# Patient Record
Sex: Female | Born: 1975 | Race: Black or African American | Hispanic: No | Smoking: Never smoker
Health system: Southern US, Community
[De-identification: ages and names within clinical notes are randomized; demographics above are authoritative.]

## PROBLEM LIST (undated history)

## (undated) DIAGNOSIS — I10 Essential (primary) hypertension: Secondary | ICD-10-CM

## (undated) DIAGNOSIS — E119 Type 2 diabetes mellitus without complications: Secondary | ICD-10-CM

---

## 2001-03-26 ENCOUNTER — Other Ambulatory Visit: Admission: RE | Admit: 2001-03-26 | Discharge: 2001-03-26 | Payer: Self-pay | Admitting: Obstetrics and Gynecology

## 2002-03-27 ENCOUNTER — Other Ambulatory Visit: Admission: RE | Admit: 2002-03-27 | Discharge: 2002-03-27 | Payer: Self-pay | Admitting: Obstetrics and Gynecology

## 2002-12-17 ENCOUNTER — Other Ambulatory Visit: Admission: RE | Admit: 2002-12-17 | Discharge: 2002-12-17 | Payer: Self-pay | Admitting: Obstetrics and Gynecology

## 2003-02-10 ENCOUNTER — Other Ambulatory Visit: Admission: RE | Admit: 2003-02-10 | Discharge: 2003-02-10 | Payer: Self-pay | Admitting: *Deleted

## 2003-02-23 ENCOUNTER — Other Ambulatory Visit: Admission: RE | Admit: 2003-02-23 | Discharge: 2003-02-23 | Payer: Self-pay | Admitting: Obstetrics and Gynecology

## 2004-05-23 ENCOUNTER — Ambulatory Visit: Payer: Self-pay | Admitting: Family Medicine

## 2004-09-18 ENCOUNTER — Ambulatory Visit: Payer: Self-pay | Admitting: Internal Medicine

## 2005-05-17 ENCOUNTER — Inpatient Hospital Stay (HOSPITAL_COMMUNITY): Admission: AD | Admit: 2005-05-17 | Discharge: 2005-05-17 | Payer: Self-pay | Admitting: Obstetrics and Gynecology

## 2012-09-16 ENCOUNTER — Ambulatory Visit (INDEPENDENT_AMBULATORY_CARE_PROVIDER_SITE_OTHER): Payer: BC Managed Care – PPO | Admitting: Podiatry

## 2012-09-16 ENCOUNTER — Encounter: Payer: Self-pay | Admitting: Podiatry

## 2012-09-16 VITALS — BP 141/84 | HR 98

## 2012-09-16 DIAGNOSIS — M65979 Unspecified synovitis and tenosynovitis, unspecified ankle and foot: Secondary | ICD-10-CM

## 2012-09-16 DIAGNOSIS — R609 Edema, unspecified: Secondary | ICD-10-CM

## 2012-09-16 DIAGNOSIS — R600 Localized edema: Secondary | ICD-10-CM

## 2012-09-16 DIAGNOSIS — M659 Synovitis and tenosynovitis, unspecified: Secondary | ICD-10-CM

## 2012-09-16 NOTE — Progress Notes (Signed)
37 year old female presents complaining of pain and swelling on right foot since June 2014. Pain and swelling comes and goes regardless of type of shoes she is on. She has made a picture to show the degree of edema on right foot. She also has a history of right ankle sprain that happened last winter. Patient has a desk job. Today she has worn about 4" high heel shoes.   Objective: Swollen right foot lateral 1/2 including lateral ankle. No erythema noted. No skin lesions seen.  Positive of bilateral hallux valgus with bunion. Positive of elevated and hypermobile first Metatarsal bone bilateral.  Normal epicritic and tactile sensations bilateral. Normal range of motion forefoot and rearfoot joint exception of the hypermobile first MCJ bilateral.  Muscle strength is within normal. Radiographic examination reveal mild dorsal displacement of the first ray with increased first intermetatarsal angle and abducted hallux bilateral. No acute osseous changes at the lateral column of affected area right foot and ankle.  Assessment: Lesser metatarsalgia secondary to lateral weight shifting due to hypermobile first ray right. Deformity first ray bilateral. HAV with bunion bilateral.  Plan: Reviewed clinical findings and available treatment options. Patient is to try ankle brace to stabilize right foot from moving excessive frontal plane motion. Ankle brace dispensed for right. Patient is to try Metatarsal binder to stabilize the excess first ray motion in sagittal plane motion.  Return in one week with lace up walking shoes.  Will discuss to prepare for Orthotics on her next visit.

## 2012-09-23 ENCOUNTER — Ambulatory Visit (INDEPENDENT_AMBULATORY_CARE_PROVIDER_SITE_OTHER): Payer: BC Managed Care – PPO | Admitting: Podiatry

## 2012-09-23 DIAGNOSIS — R600 Localized edema: Secondary | ICD-10-CM | POA: Insufficient documentation

## 2012-09-23 DIAGNOSIS — M659 Synovitis and tenosynovitis, unspecified: Secondary | ICD-10-CM | POA: Insufficient documentation

## 2012-09-23 DIAGNOSIS — R609 Edema, unspecified: Secondary | ICD-10-CM

## 2012-09-23 MED ORDER — ARCH BANDAGE MISC: Status: AC

## 2012-09-23 NOTE — Progress Notes (Signed)
Follow up on right foot and ankle swelling. Ankle injury in March, swelling started in June.  Wearing dress flat shoes that is required at work.   Elevated first ray.  Increased STJ pronation. Ankle brace working well.  Assessment: Doing well with ankle brace and swelling is down. No swelling for the past week.  Plan: Will monitor for a few month before making orthotics per patient's request.

## 2012-10-15 ENCOUNTER — Other Ambulatory Visit: Payer: Self-pay | Admitting: *Deleted

## 2012-10-15 DIAGNOSIS — R609 Edema, unspecified: Secondary | ICD-10-CM

## 2012-11-05 ENCOUNTER — Encounter: Payer: Self-pay | Admitting: Vascular Surgery

## 2012-11-06 ENCOUNTER — Ambulatory Visit (HOSPITAL_COMMUNITY)
Admission: RE | Admit: 2012-11-06 | Discharge: 2012-11-06 | Disposition: A | Discharge status: Home / Self Care | Payer: BC Managed Care – PPO | Source: Ambulatory Visit | Attending: Vascular Surgery | Admitting: Vascular Surgery

## 2012-11-06 ENCOUNTER — Ambulatory Visit (INDEPENDENT_AMBULATORY_CARE_PROVIDER_SITE_OTHER): Payer: BC Managed Care – PPO | Admitting: Vascular Surgery

## 2012-11-06 ENCOUNTER — Encounter: Payer: Self-pay | Admitting: Vascular Surgery

## 2012-11-06 VITALS — BP 137/88 | HR 93 | Ht 62.0 in | Wt 176.2 lb

## 2012-11-06 DIAGNOSIS — R609 Edema, unspecified: Secondary | ICD-10-CM

## 2012-11-06 NOTE — Progress Notes (Signed)
[facility] [place] HISTORY AND PHYSICAL   History of Present Illness:  Patient is a 37 y.o. year old female who presents for evaluation of right foot and ankle swelling. The patient injured her right ankle approximately 9 months ago. Since that time she has had intermittent persistent swelling of the right foot and ankle area. This is somewhat improved but persists. It becomes more swollen and she is wearing certain types of shoes. She denies any prior history of DVT. She denies any prior history of lymphedema. She is scheduled for an MRI of the foot and ankle later this month. She is being followed by Dr. Penni Bombard. History reviewed. No pertinent past medical history. she denies prior trauma to the right lower extremity. No prior operations to her lower extremities  History reviewed. No pertinent past surgical history.   Social History History  Substance Use Topics  . Smoking status: Never Smoker   . Smokeless tobacco: Never Used  . Alcohol Use: Yes     Comment: socially    Family History Family History  Problem Relation Age of Onset  . Hypertension Mother     Allergies  Allergies  Allergen Reactions  . Penicillins Hives     Current Outpatient Prescriptions  Medication Sig Dispense Refill  . Azelastine HCl (ASTEPRO) 0.15 % SOLN Place into the nose as needed.      Marland Kitchen NUVARING 0.12-0.015 MG/24HR vaginal ring       . psyllium (REGULOID) 0.52 G capsule Take 0.52 g by mouth daily.       No current facility-administered medications for this visit.    ROS:   General:  No weight loss, Fever, chills  HEENT: No recent headaches, no nasal bleeding, no visual changes, no sore throat  Neurologic: No dizziness, blackouts, seizures. No recent symptoms of stroke or mini- stroke. No recent episodes of slurred speech, or temporary blindness.  Cardiac: No recent episodes of chest pain/pressure, no shortness of breath at rest.  No shortness of breath with exertion.   Denies history of atrial fibrillation or irregular heartbeat  Vascular: No history of rest pain in feet.  No history of claudication.  No history of non-healing ulcer, No history of DVT   Pulmonary: No home oxygen, no productive cough, no hemoptysis,  No asthma or wheezing  Musculoskeletal:  [ ]  Arthritis, [ ]  Low back pain,  [ ]  Joint pain  Hematologic:No history of hypercoagulable state.  No history of easy bleeding.  No history of anemia  Gastrointestinal: No hematochezia or melena,  No gastroesophageal reflux, no trouble swallowing  Urinary: [ ]  chronic Kidney disease, [ ]  on HD - [ ]  MWF or [ ]  TTHS, [ ]  Burning with urination, [ ]  Frequent urination, [ ]  Difficulty urinating;   Skin: No rashes  Psychological: No history of anxiety,  No history of depression   Physical Examination  Filed Vitals:   11/06/12 1326  BP: 137/88  Pulse: 93  Height: 5\' 2"  (1.575 m)  Weight: 176 lb 3.2 oz (79.924 kg)  SpO2: 100%    Body mass index is 32.22 kg/(m^2).  General:  Alert and oriented, no acute distress HEENT: Normal Neck: No bruit or JVD Pulmonary: Clear to auscultation bilaterally Cardiac: Regular Rate and Rhythm without murmur Abdomen: Soft, non-tender, non-distended, no mass Skin: No rash Extremity Pulses:  2+ radial, brachial, femoral, dorsalis pedis, posterior tibial pulses bilaterally Musculoskeletal: No deformity trace edema left calf ankle foot no buffalo hump no squaring of toes  no ulcers  Neurologic: Upper and lower extremity motor 5/5 and symmetric  DATA: Patient had a venous reflux exam today. She had evidence of deep venous reflux in the common femoral veins bilaterally. She also had some mild superficial reflux in the greater saphenous and saphenofemoral junction of the right leg.   ASSESSMENT: Chronic right foot and ankle swelling. This usually is not related to venous disease. She does have some evidence of mild deep and superficial venous reflux in the right  side. However if all of her symptoms were related to venous disease we would expect were swelling up the leg progresses as the day goes on and it is improved somewhat by elevation. Her symptoms did not really have all these features. Most of her swelling seems to be confined to the foot and ankle. This may still be secondary to her previous ankle trauma. However, she could have some symptoms of lymphedema. Her pending MRI may reveal more information regarding this. She had no evidence of arterial disease or DVT.   PLAN:  Discussed with the patient today weight loss program which may improve her reflux disease in her veins as well as her swelling. She will return on as-needed basis if the swelling becomes worse over time. If her symptoms persist or worsen would consider repeat venous study or give further consideration to possible lymphedema.  Fabienne Bruns, MD [facility] [place] Office: [phone] Pager: [phone]

## 2017-05-11 ENCOUNTER — Encounter (HOSPITAL_BASED_OUTPATIENT_CLINIC_OR_DEPARTMENT_OTHER): Payer: Self-pay | Admitting: Emergency Medicine

## 2017-05-11 ENCOUNTER — Emergency Department (HOSPITAL_BASED_OUTPATIENT_CLINIC_OR_DEPARTMENT_OTHER): Payer: BLUE CROSS/BLUE SHIELD

## 2017-05-11 ENCOUNTER — Emergency Department (HOSPITAL_BASED_OUTPATIENT_CLINIC_OR_DEPARTMENT_OTHER)
Admission: EM | Admit: 2017-05-11 | Discharge: 2017-05-11 | Disposition: A | Discharge status: Home / Self Care | Payer: BLUE CROSS/BLUE SHIELD | Attending: Emergency Medicine | Admitting: Emergency Medicine

## 2017-05-11 DIAGNOSIS — R000 Tachycardia, unspecified: Secondary | ICD-10-CM | POA: Diagnosis not present

## 2017-05-11 DIAGNOSIS — Z79899 Other long term (current) drug therapy: Secondary | ICD-10-CM | POA: Insufficient documentation

## 2017-05-11 DIAGNOSIS — M545 Low back pain: Secondary | ICD-10-CM | POA: Diagnosis not present

## 2017-05-11 DIAGNOSIS — R12 Heartburn: Secondary | ICD-10-CM | POA: Diagnosis not present

## 2017-05-11 LAB — CBC WITH DIFFERENTIAL/PLATELET
BASOS PCT: 0 %
Basophils Absolute: 0 10*3/uL (ref 0.0–0.1)
Eosinophils Absolute: 0.1 10*3/uL (ref 0.0–0.7)
Eosinophils Relative: 1 %
HCT: 35.3 % — ABNORMAL LOW (ref 36.0–46.0)
Hemoglobin: 11.8 g/dL — ABNORMAL LOW (ref 12.0–15.0)
LYMPHS PCT: 25 %
Lymphs Abs: 1.4 10*3/uL (ref 0.7–4.0)
MCH: 26.8 pg (ref 26.0–34.0)
MCHC: 33.4 g/dL (ref 30.0–36.0)
MCV: 80.2 fL (ref 78.0–100.0)
MONO ABS: 0.8 10*3/uL (ref 0.1–1.0)
MONOS PCT: 14 %
NEUTROS ABS: 3.4 10*3/uL (ref 1.7–7.7)
Neutrophils Relative %: 60 %
Platelets: 294 10*3/uL (ref 150–400)
RBC: 4.4 MIL/uL (ref 3.87–5.11)
RDW: 14.1 % (ref 11.5–15.5)
WBC: 5.6 10*3/uL (ref 4.0–10.5)

## 2017-05-11 LAB — BASIC METABOLIC PANEL
Anion gap: 12 (ref 5–15)
BUN: 7 mg/dL (ref 6–20)
CO2: 19 mmol/L — ABNORMAL LOW (ref 22–32)
CREATININE: 0.86 mg/dL (ref 0.44–1.00)
Calcium: 8.6 mg/dL — ABNORMAL LOW (ref 8.9–10.3)
Chloride: 104 mmol/L (ref 101–111)
GFR calc Af Amer: >60 mL/min (ref >60)
GFR calc non Af Amer: >60 mL/min (ref >60)
Glucose, Bld: 161 mg/dL — ABNORMAL HIGH (ref 65–99)
Potassium: 3.4 mmol/L — ABNORMAL LOW (ref 3.5–5.1)
Sodium: 135 mmol/L (ref 135–145)

## 2017-05-11 LAB — D-DIMER, QUANTITATIVE: D-Dimer, Quant: 1.16 ug/mL-FEU — ABNORMAL HIGH (ref 0.00–0.50)

## 2017-05-11 LAB — TROPONIN I

## 2017-05-11 LAB — LIPASE, BLOOD: Lipase: 22 U/L (ref 11–51)

## 2017-05-11 LAB — PREGNANCY, URINE: Preg Test, Ur: NEGATIVE

## 2017-05-11 MED ORDER — SUCRALFATE 1 GM/10ML PO SUSP
1.0000 g | Freq: Once | ORAL | Status: AC
  Administered 2017-05-11: 1 g via ORAL
  Filled 2017-05-11: qty 10

## 2017-05-11 MED ORDER — PANTOPRAZOLE SODIUM 40 MG IV SOLR
40.0000 mg | Freq: Once | INTRAVENOUS | Status: AC
Start: 2017-05-11 — End: 2017-05-11
  Administered 2017-05-11: 40 mg via INTRAVENOUS
  Filled 2017-05-11: qty 40

## 2017-05-11 MED ORDER — OMEPRAZOLE 20 MG PO CPDR: DELAYED_RELEASE_CAPSULE | ORAL | 0 refills | Status: AC

## 2017-05-11 MED ORDER — IOPAMIDOL (ISOVUE-370) INJECTION 76%
100.0000 mL | Freq: Once | INTRAVENOUS | Status: AC | PRN
  Administered 2017-05-11: 100 mL via INTRAVENOUS

## 2017-05-11 MED ORDER — SUCRALFATE 1 G PO TABS: 1.0000 g | ORAL_TABLET | Freq: Three times a day (TID) | ORAL | 0 refills | Status: AC

## 2017-05-11 NOTE — ED Notes (Signed)
[facility] Pt now admitting to 6 hrs of travel ~ 1 week ago. 3 hour drive one way.

## 2017-05-11 NOTE — ED Notes (Signed)
Alert, NAD, calm, interactive, resps e/u, speaking in clear complete sentences, no dyspnea noted, skin W&D, VSS, c/o chest and back pain, HR tachy, has nuvaring,(denies: long distance travel, recent surgery, smoking, nausea, fever, dizziness or visual changes). [facility]

## 2017-05-11 NOTE — ED Notes (Signed)
No changes, alert, NAD, calm, no dyspnea, to xray.

## 2017-05-11 NOTE — ED Provider Notes (Signed)
[facility] Provider Note: Lowella DellJ. Lane Elric Tirado, MD, FACEP  CSN: [id number] MRN: [id number] ARRIVAL: 05/11/17 at 0316 ROOM: MH02/MH02   CHIEF COMPLAINT  Chest Pain and Back Pain   HISTORY OF PRESENT ILLNESS  05/11/17 4:46 AM Wendy Mueller is a 42 y.o. female with a 3-day history of substernal discomfort which she describes as heartburn.  The heartburn is worse after eating.  She has had some transient relief with antacids but the pain keeps returning.  She has had no associated shortness of breath, nausea, vomiting, diarrhea, lightheadedness or weakness.  Yesterday evening she developed pain in her left back.  She describes this pain as aching and is somewhat intermittent.  She rates it as a 5 out of 10.  It is not worse with deep breathing or movement.  She denies lower extremity pain or edema but has had recent car travel.  She was noted to be tachycardic on arrival.   History reviewed. No pertinent past medical history.  No past surgical history on file.  Family History  Problem Relation Age of Onset  . Hypertension Mother     Social History   Tobacco Use  . Smoking status: Never Smoker  . Smokeless tobacco: Never Used  Substance Use Topics  . Alcohol use: Yes    Comment: socially  . Drug use: No    Prior to Admission medications   Medication Sig Start Date End Date Taking? Authorizing Provider  Azelastine HCl (ASTEPRO) 0.15 % SOLN Place into the nose as needed.    [provider]  NUVARING 0.12-0.015 MG/24HR vaginal ring  09/06/12   [provider]  psyllium (REGULOID) 0.52 G capsule Take 0.52 g by mouth daily.    [provider]    Allergies Penicillins   REVIEW OF SYSTEMS  Negative except as noted here or in the History of Present Illness.   PHYSICAL EXAMINATION  Initial Vital Signs Blood pressure (!) 147/90, pulse 100, resp. rate 15, height 5\' 2"  (1.575 m), weight 84.8 kg (187 lb), SpO2 99 %.  Examination General:  Well-developed, well-nourished female in no acute distress; appearance consistent with age of record HENT: normocephalic; atraumatic Eyes: pupils equal, round and reactive to light; extraocular muscles intact Neck: supple Heart: regular rate and rhythm; tachycardia Lungs: clear to auscultation bilaterally Abdomen: soft; nondistended; nontender; no masses or hepatosplenomegaly; bowel sounds present Back: Nontender Extremities: No deformity; full range of motion; pulses normal; no edema; no calf tenderness Neurologic: Awake, alert and oriented; motor function intact in all extremities and symmetric; no facial droop Skin: Warm and dry Psychiatric: Anxious   RESULTS  Summary of this visit's results, reviewed by myself:   EKG Interpretation  Date/Time:  Saturday May 11 2017 03:33:06 EDT Ventricular Rate:  103 PR Interval:    QRS Duration: 78 QT Interval:  332 QTC Calculation: 435 R Axis:   61 Text Interpretation:  Sinus tachycardia Probable left atrial enlargement No previous ECGs available Confirmed by Cordon Gassett, Jonny RuizJohn ([id number]) on 05/11/2017 3:42:33 AM      Laboratory Studies: Results for orders placed or performed during the hospital encounter of 05/11/17 (from the past 24 hour(s))  Basic metabolic panel     Status: Abnormal   Collection Time: 05/11/17  3:52 AM  Result Value Ref Range   Sodium 135 135 - 145 mmol/L   Potassium 3.4 (L) 3.5 - 5.1 mmol/L   Chloride 104 101 - 111 mmol/L   CO2 19 (L) 22 - 32 mmol/L  Glucose, Bld 161 (H) 65 - 99 mg/dL   BUN 7 6 - 20 mg/dL   Creatinine, Ser 1.61 0.44 - 1.00 mg/dL   Calcium 8.6 (L) 8.9 - 10.3 mg/dL   GFR calc non Af Amer >60 >60 mL/min   GFR calc Af Amer >60 >60 mL/min   Anion gap 12 5 - 15  Troponin I     Status: None   Collection Time: 05/11/17  3:52 AM  Result Value Ref Range   Troponin I <0.03 <0.03 ng/mL  Pregnancy, urine     Status: None   Collection Time: 05/11/17  3:52 AM  Result Value Ref Range   Preg Test, Ur NEGATIVE  NEGATIVE  CBC with Differential/Platelet     Status: Abnormal   Collection Time: 05/11/17  3:52 AM  Result Value Ref Range   WBC 5.6 4.0 - 10.5 K/uL   RBC 4.40 3.87 - 5.11 MIL/uL   Hemoglobin 11.8 (L) 12.0 - 15.0 g/dL   HCT 09.6 (L) 04.5 - 40.9 %   MCV 80.2 78.0 - 100.0 fL   MCH 26.8 26.0 - 34.0 pg   MCHC 33.4 30.0 - 36.0 g/dL   RDW 81.1 91.4 - 78.2 %   Platelets 294 150 - 400 K/uL   Neutrophils Relative % 60 %   Neutro Abs 3.4 1.7 - 7.7 K/uL   Lymphocytes Relative 25 %   Lymphs Abs 1.4 0.7 - 4.0 K/uL   Monocytes Relative 14 %   Monocytes Absolute 0.8 0.1 - 1.0 K/uL   Eosinophils Relative 1 %   Eosinophils Absolute 0.1 0.0 - 0.7 K/uL   Basophils Relative 0 %   Basophils Absolute 0.0 0.0 - 0.1 K/uL  D-dimer, quantitative (not at [facility])     Status: Abnormal   Collection Time: 05/11/17  3:52 AM  Result Value Ref Range   D-Dimer, Quant 1.16 (H) 0.00 - 0.50 ug/mL-FEU  Lipase, blood     Status: None   Collection Time: 05/11/17  3:52 AM  Result Value Ref Range   Lipase 22 11 - 51 U/L   Imaging Studies: Dg Chest 2 View  Result Date: 05/11/2017 CLINICAL DATA:  42 year old female with chest pain. EXAM: CHEST - 2 VIEW COMPARISON:  None. FINDINGS: The heart size and mediastinal contours are within normal limits. Both lungs are clear. The visualized skeletal structures are unremarkable. IMPRESSION: No active cardiopulmonary disease. Electronically Signed   By: Elgie Collard M.D.   On: 05/11/2017 04:04   Ct Angio Chest Pe W And/or Wo Contrast  Result Date: 05/11/2017 CLINICAL DATA:  42 y/o F; several days of indigestion. Positive D-dimer. EXAM: CT ANGIOGRAPHY CHEST WITH CONTRAST TECHNIQUE: Multidetector CT imaging of the chest was performed using the standard protocol during bolus administration of intravenous contrast. Multiplanar CT image reconstructions and MIPs were obtained to evaluate the vascular anatomy. CONTRAST:  ISOVUE-370 IOPAMIDOL (ISOVUE-370) INJECTION 76% COMPARISON:   05/11/2017 chest radiograph FINDINGS: Cardiovascular: Satisfactory opacification of the pulmonary arteries to the segmental level. No evidence of pulmonary embolism. Normal heart size. No pericardial effusion. Mediastinum/Nodes: No enlarged mediastinal, hilar, or axillary lymph nodes. Thyroid gland, trachea, and esophagus demonstrate no significant findings. Lungs/Pleura: Lungs are clear. No pleural effusion or pneumothorax. Upper Abdomen: No acute abnormality. Musculoskeletal: No chest wall abnormality. No acute or significant osseous findings. Review of the MIP images confirms the above findings. IMPRESSION: 1. No pulmonary embolus identified. 2. Unremarkable CTA of the chest. Electronically Signed   By: Buzzy Han.D.  On: 05/11/2017 06:15    ED COURSE  Nursing notes and initial vitals signs, including pulse oximetry, reviewed.  Vitals:   05/11/17 0530 05/11/17 0545 05/11/17 0600 05/11/17 0615  BP: (!) 143/85 (!) 140/93 134/90 133/87  Pulse: (!) 106 96 94 90  Resp: (!) 22 17 19 16   SpO2: 99% 100% 98% 100%  Weight:      Height:       6:25 AM Patient advised of reassuring diagnostic studies.  Her vital signs have normalized.  I suspect her heartburn is due to acid reflux or gastritis.  We will start her on a PPI and Carafate.  The back pain may or may not be related but we have ruled out ulnar embolism.  PROCEDURES    ED DIAGNOSES     ICD-10-CM   1. Heartburn R12        Malaki Koury, Jonny Ruiz, MD 05/11/17 [phone]

## 2017-05-11 NOTE — ED Triage Notes (Signed)
Pt states 2-3 days ago started experiencing what she thought was indigestion. Pt took antacids with minimal relief. Pt states the pain feels different and is in her back. Pt denies SHOB, N/V, lightheadness or dizziness. Pain described as aching and intermittent

## 2017-05-11 NOTE — ED Notes (Signed)
Back from CT, speaking on phone, no changes, alert, NAD, calm, interactive, rates upper back pain 4/10, (denies: sob, nausea, dizziness or other sx), [facility].

## 2017-05-11 NOTE — ED Notes (Signed)
EDP into room, at [facility].  ?

## 2017-05-11 NOTE — ED Notes (Signed)
Up to b/r, steady gait, denies dizziness.

## 2017-05-11 NOTE — ED Notes (Signed)
Back from xray, alert, NAD, calm, no changes.   

## 2020-03-07 IMAGING — DX DG CHEST 2V
2 series · 2 of 2 positions shown · non-contrast
Comparison: None.

CLINICAL DATA: 41-year-old female with chest pain.

EXAM:
CHEST - 2 VIEW

[chest pa]
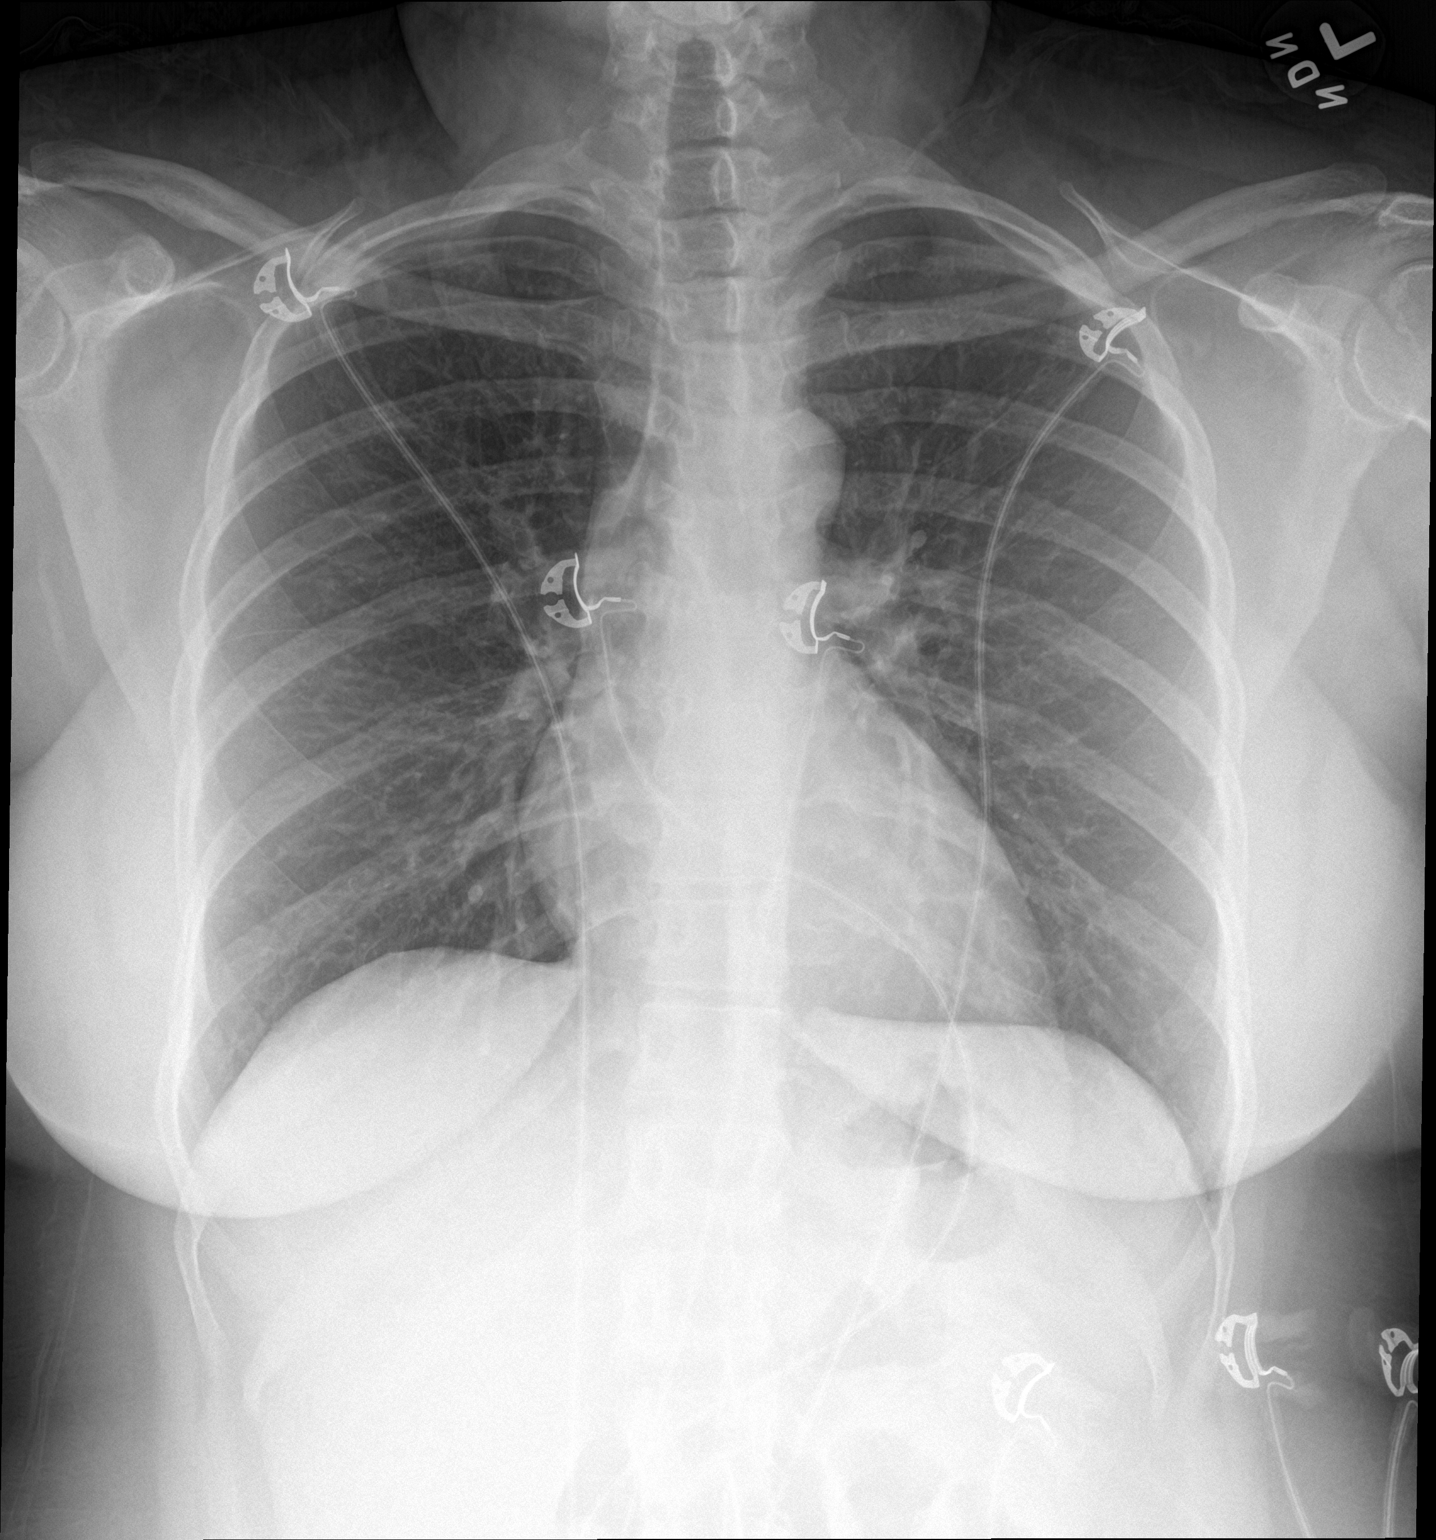

[chest lat]
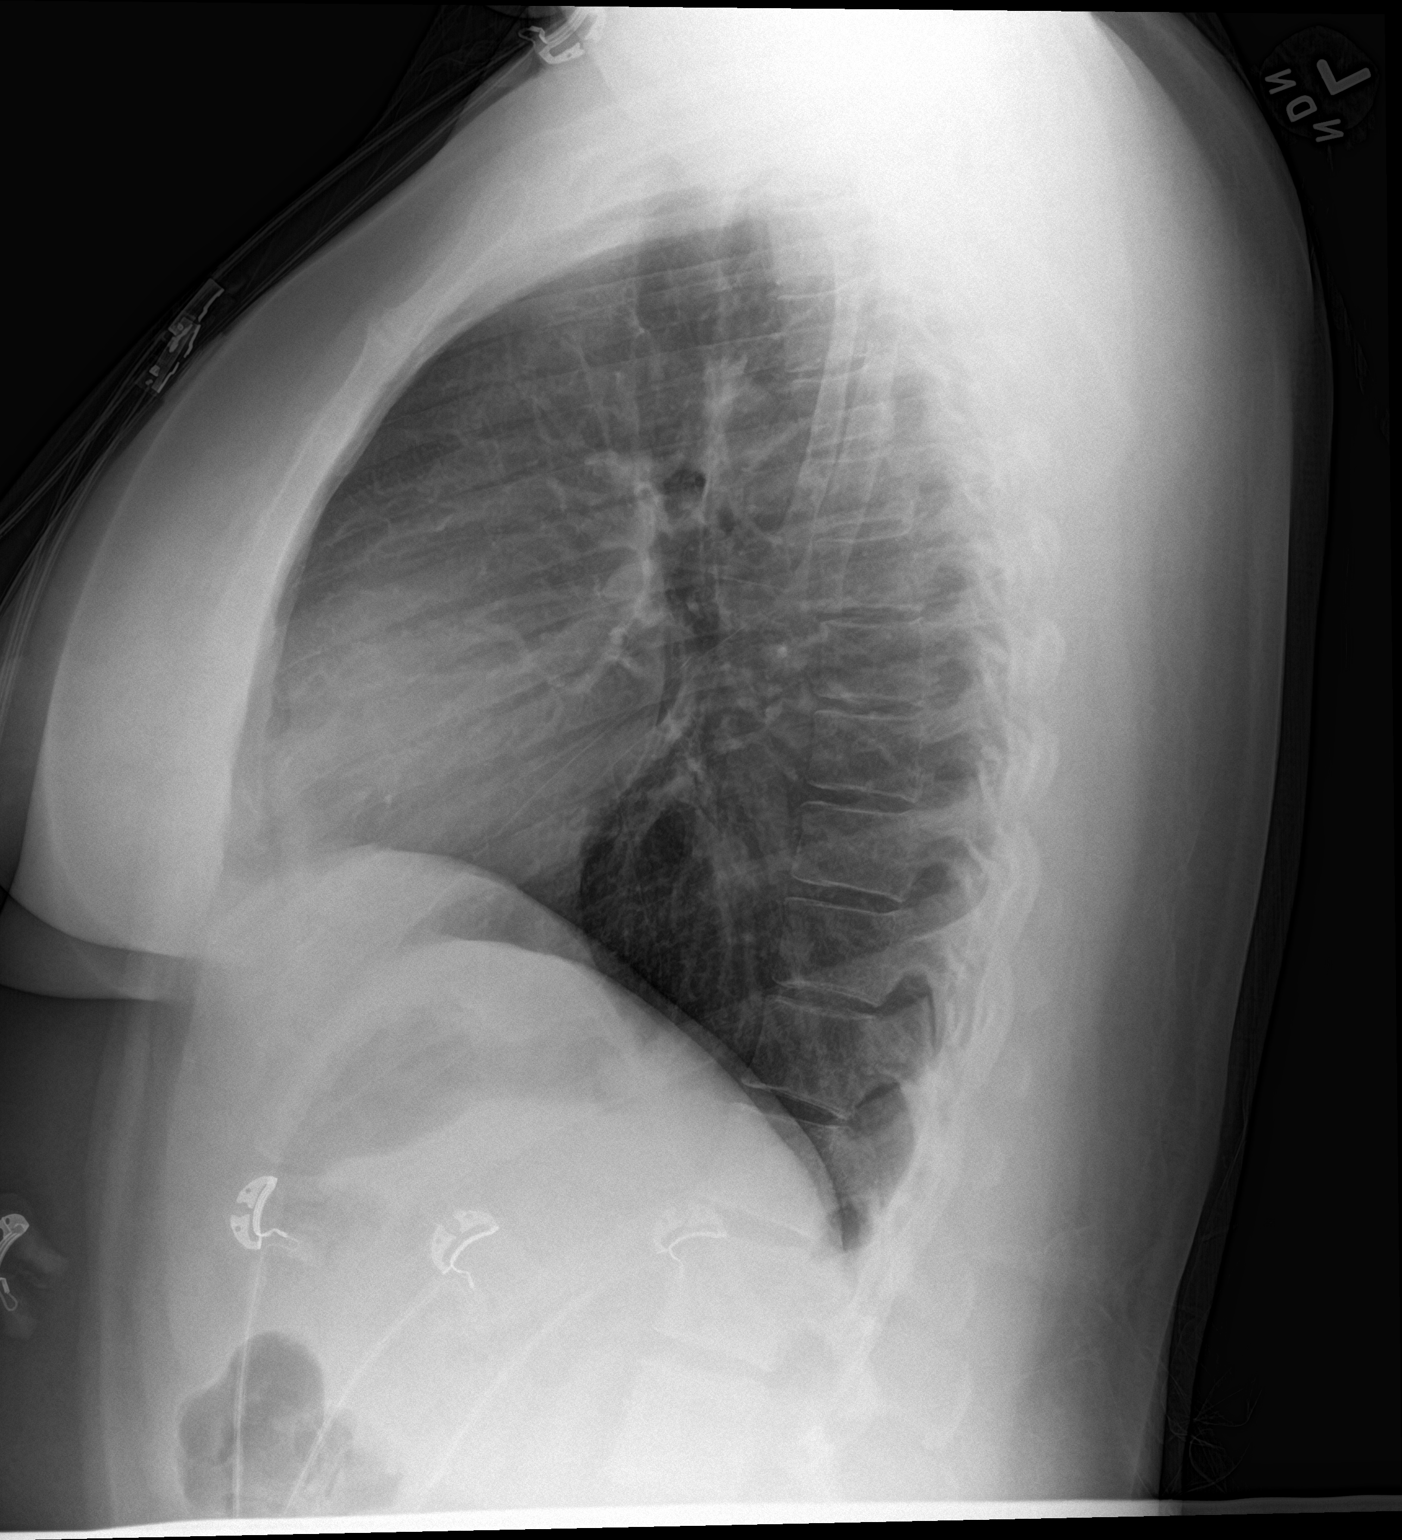

[2 of 2 positions shown; findings below may reference images not displayed]

FINDINGS: The heart size and mediastinal contours are within normal limits.
Both lungs are clear. The visualized skeletal structures are
unremarkable.
IMPRESSION: No active cardiopulmonary disease.

## 2020-03-07 IMAGING — CT CT ANGIO CHEST
2 of 8 series · 19 of 36 positions shown · IV contrast (iopamidol)
Comparison: 05/11/2017 chest radiograph

CLINICAL DATA: 41 y/o F; several days of indigestion. Positive
D-dimer.

EXAM:
CT ANGIOGRAPHY CHEST WITH CONTRAST
TECHNIQUE: Multidetector CT imaging of the chest was performed using the
standard protocol during bolus administration of intravenous
contrast. Multiplanar CT image reconstructions and MIPs were
obtained to evaluate the vascular anatomy.
CONTRAST:  100mL DQ2G58-NVM IOPAMIDOL (DQ2G58-NVM) INJECTION 76%

[Series 6: pe thins · axial · 0.63mm/px · z∈[+1181,+1424]mm · 18 of 273 slices shown]
[im 15/273  lung]
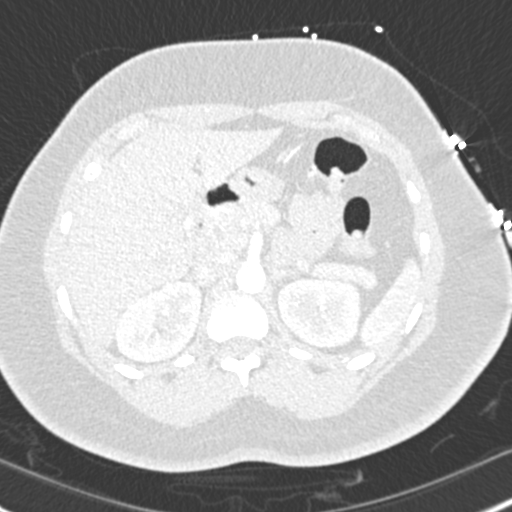
[im 29/273  mediastinal]
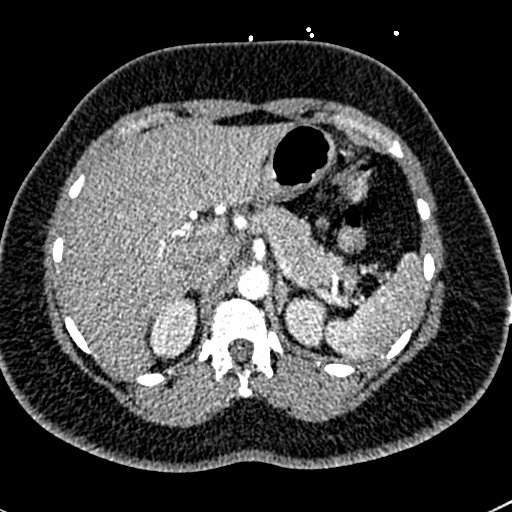
[im 43/273  lung]
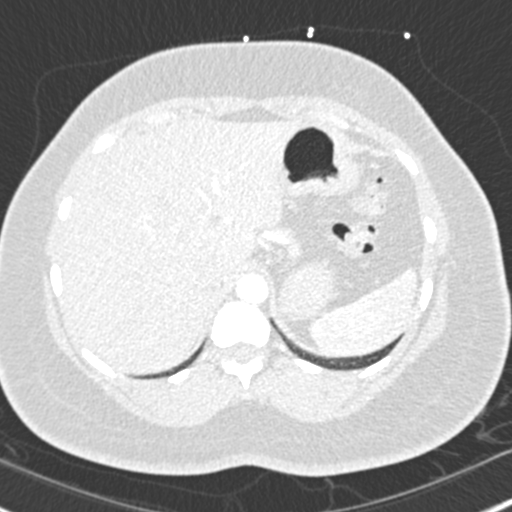
[im 58/273  mediastinal]
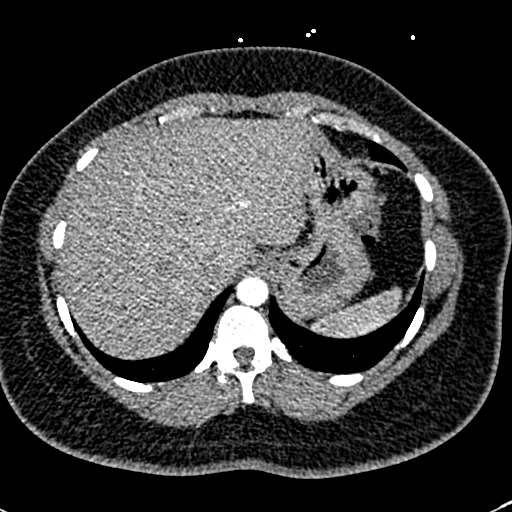
[im 72/273  lung]
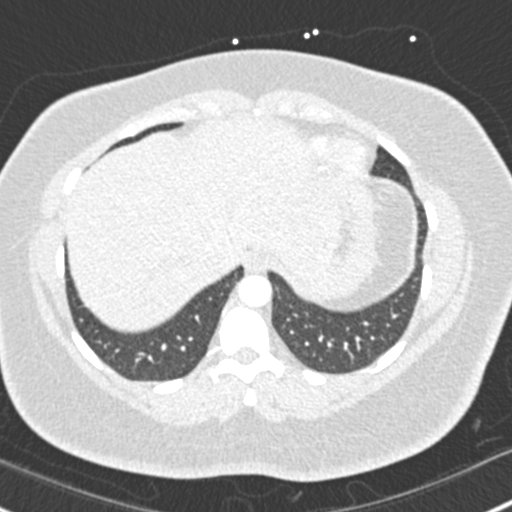
[im 86/273  mediastinal]
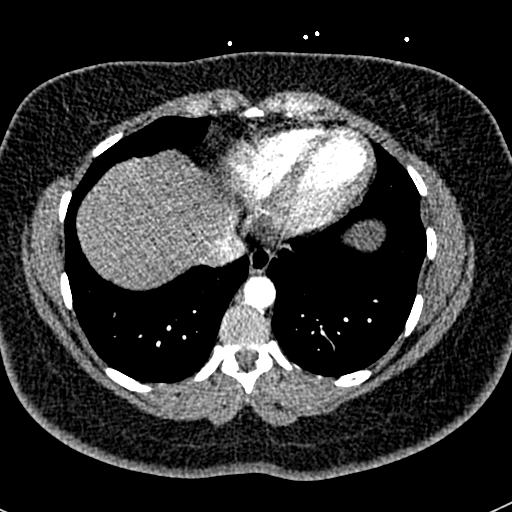
[im 101/273  lung]
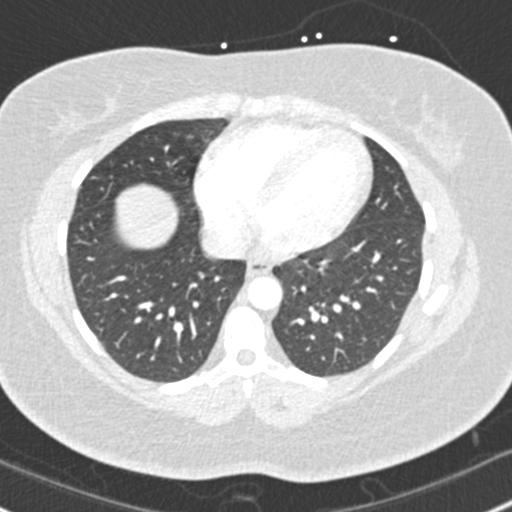
[im 115/273  mediastinal]
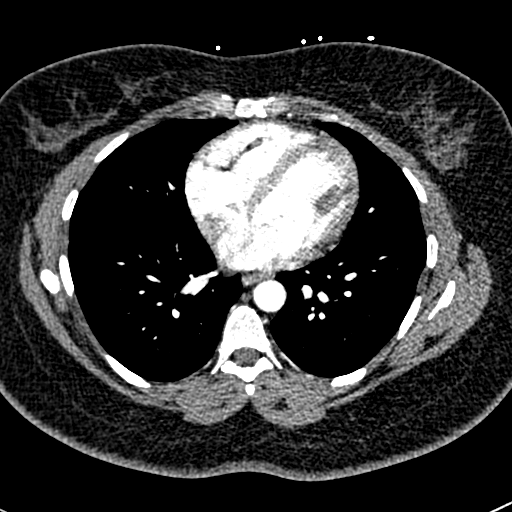
[im 129/273  lung]
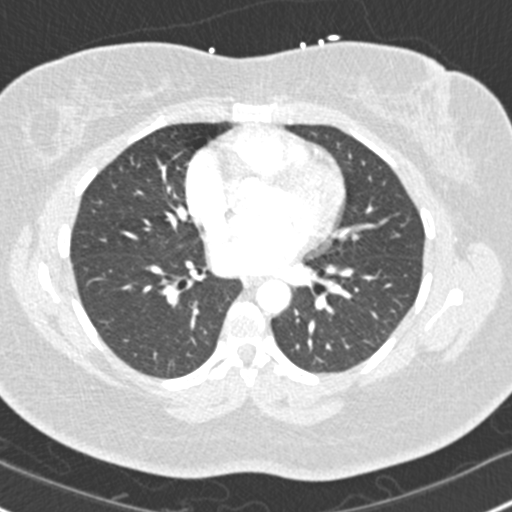
[im 144/273  mediastinal]
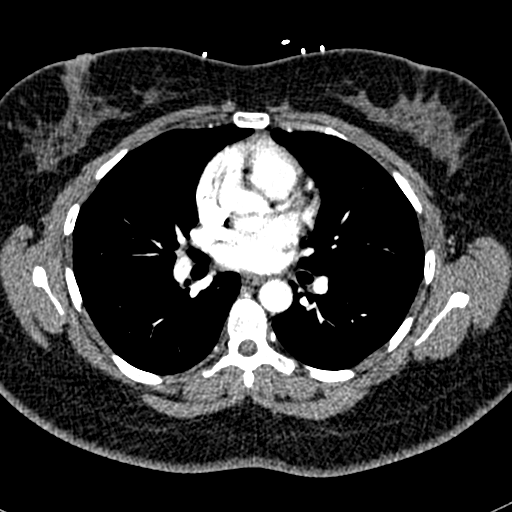
[im 158/273  lung]
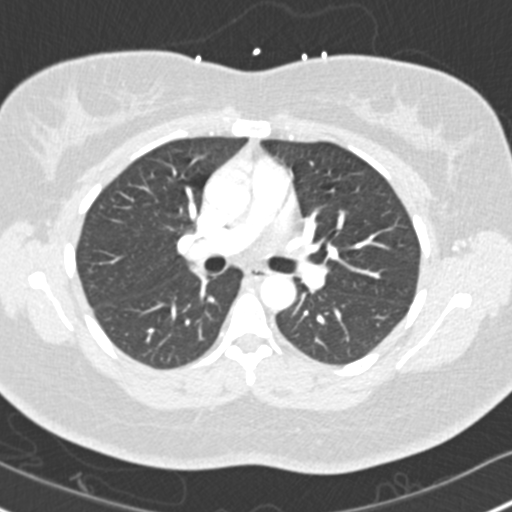
[im 172/273  mediastinal]
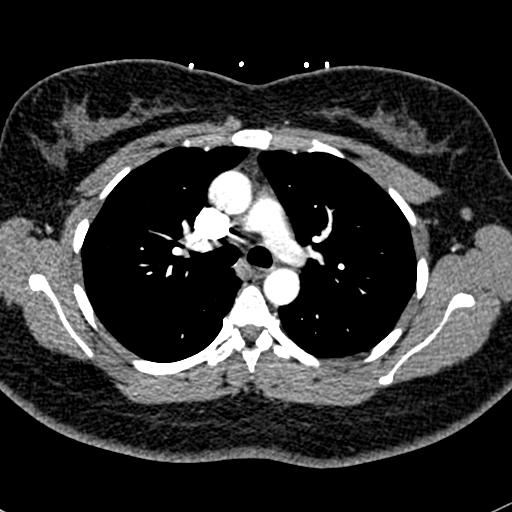
[im 187/273  lung]
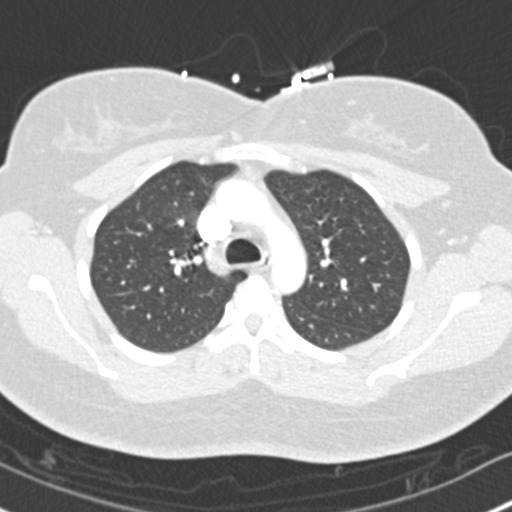
[im 201/273  mediastinal]
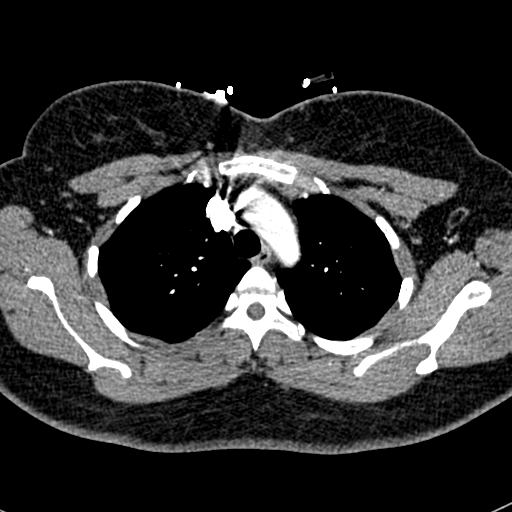
[im 215/273  lung]
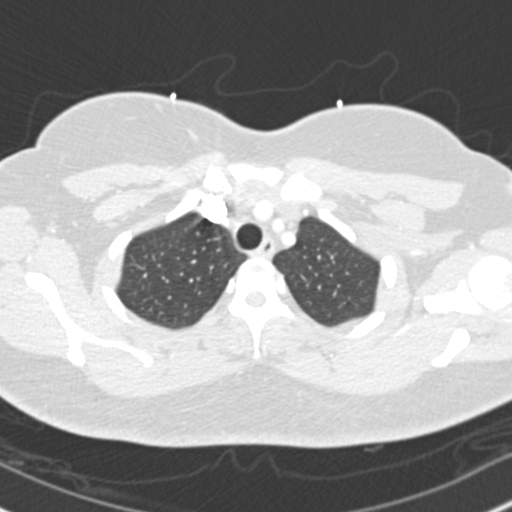
[im 230/273  mediastinal]
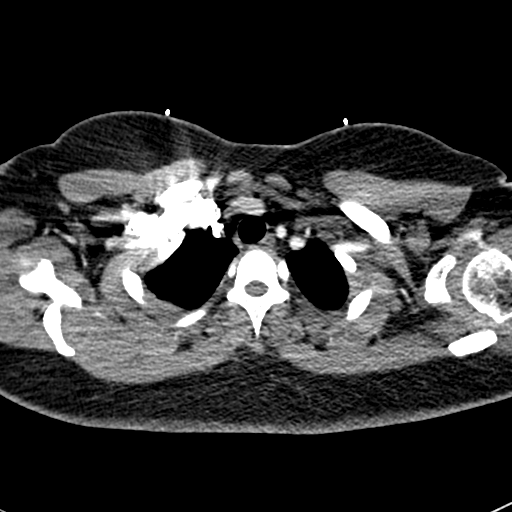
[im 244/273  lung]
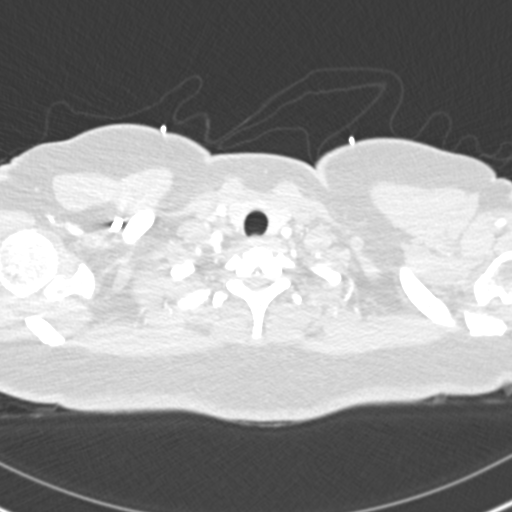
[im 258/273  mediastinal]
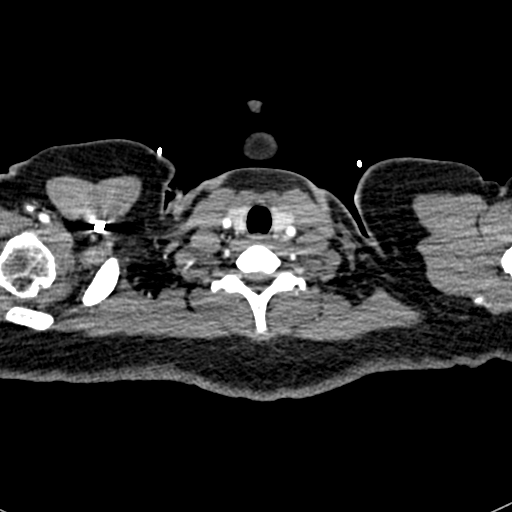

[Series 7: pe coronal mpr · coronal · 0.54mm/px · 1 of 130 slices shown]
[im 65/130  mediastinal]
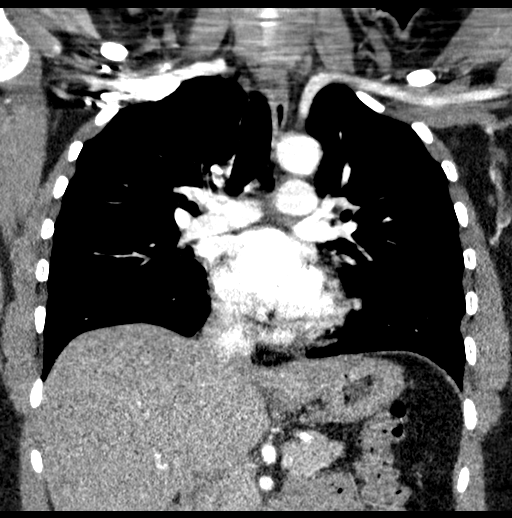

[19 of 36 positions shown; findings below may reference images not displayed]

FINDINGS: Cardiovascular: Satisfactory opacification of the pulmonary arteries
to the segmental level. No evidence of pulmonary embolism. Normal
heart size. No pericardial effusion.

Mediastinum/Nodes: No enlarged mediastinal, hilar, or axillary lymph
nodes. Thyroid gland, trachea, and esophagus demonstrate no
significant findings.

Lungs/Pleura: Lungs are clear. No pleural effusion or pneumothorax.

Upper Abdomen: No acute abnormality.

Musculoskeletal: No chest wall abnormality. No acute or significant
osseous findings.

Review of the MIP images confirms the above findings.
IMPRESSION: 1. No pulmonary embolus identified.
2. Unremarkable CTA of the chest.

By: Prince Owusu Christlike M.D.

## 2022-08-21 ENCOUNTER — Telehealth: Payer: BC Managed Care – PPO | Admitting: Family Medicine

## 2022-08-21 DIAGNOSIS — J069 Acute upper respiratory infection, unspecified: Secondary | ICD-10-CM

## 2022-08-21 MED ORDER — PROMETHAZINE-DM 6.25-15 MG/5ML PO SYRP
5.0000 mL | ORAL_SOLUTION | Freq: Four times a day (QID) | ORAL | 0 refills | Status: AC | PRN
Start: 2022-08-21 — End: ?

## 2022-08-21 MED ORDER — BENZONATATE 100 MG PO CAPS
100.00 mg | ORAL_CAPSULE | Freq: Three times a day (TID) | ORAL | 0 refills | Status: AC | PRN
Start: 2022-08-21 — End: ?

## 2022-08-21 NOTE — Patient Instructions (Addendum)
  Wendy Mueller, thank you for joining Freddy Finner, NP for today's virtual visit.  While this provider is not your primary care provider (PCP), if your PCP is located in our provider database this encounter information will be shared with them immediately following your visit.   A [facility] MyChart account gives you access to today's visit and all your visits, tests, and labs performed at [facility] " click here if you don't have a [facility] MyChart account or go to [email or web address]  Consent: (Patient) Wendy Mueller provided verbal consent for this virtual visit at the beginning of the encounter.  Current Medications:  Current Outpatient Medications:    Azelastine HCl (ASTEPRO) 0.15 % SOLN, Place into the nose as needed., Disp: , Rfl:    NUVARING 0.12-0.015 MG/24HR vaginal ring, , Disp: , Rfl:    omeprazole (PRILOSEC) 20 MG capsule, Take 1 capsule daily at least 30 minutes before first dose of Carafate., Disp: 30 capsule, Rfl: 0   psyllium (REGULOID) 0.52 G capsule, Take 0.52 g by mouth daily., Disp: , Rfl:    sucralfate (CARAFATE) 1 g tablet, Take 1 tablet (1 g total) by mouth 4 (four) times daily -  with meals and at bedtime., Disp: 40 tablet, Rfl: 0   Medications ordered in this encounter:  No orders of the defined types were placed in this encounter.    *If you need refills on other medications prior to your next appointment, please contact your pharmacy*  Follow-Up: Call back or seek an in-person evaluation if the symptoms worsen or if the condition fails to improve as anticipated.  [facility] [phone]  Other Instructions  COVID test is recommended  URI recommendations: - Increased rest - Increasing Fluids - Acetaminophen / ibuprofen as needed for fever/pain.  - Salt water gargling, chloraseptic spray and throat lozenges - Mucinex if mucus is present and increasing.  - Saline nasal spray if congestion or if nasal  passages feel dry. - Humidifying the air.     If you have been instructed to have an in-person evaluation today at a local Urgent Care facility, please use the link below. It will take you to a list of all of our available [facility], including address, phone number and hours of operation. Please do not delay care.  [facility]  If you or a family member do not have a primary care provider, use the link below to schedule a visit and establish care. When you choose a [facility] primary care physician or advanced practice provider, you gain a long-term partner in health. Find a Primary Care Provider  Learn more about [facility] in-office and virtual care options:  - Get Care Now

## 2022-08-21 NOTE — Progress Notes (Signed)
Virtual Visit Consent   Tarshia Kot Tory, you are scheduled for a virtual visit with a [facility] provider today. Just as with appointments in the office, your consent must be obtained to participate. Your consent will be active for this visit and any virtual visit you may have with one of our providers in the next 365 days. If you have a MyChart account, a copy of this consent can be sent to you electronically.  As this is a virtual visit, video technology does not allow for your provider to perform a traditional examination. This may limit your provider's ability to fully assess your condition. If your provider identifies any concerns that need to be evaluated in person or the need to arrange testing (such as labs, EKG, etc.), we will make arrangements to do so. Although advances in technology are sophisticated, we cannot ensure that it will always work on either your end or our end. If the connection with a video visit is poor, the visit may have to be switched to a telephone visit. With either a video or telephone visit, we are not always able to ensure that we have a secure connection.  By engaging in this virtual visit, you consent to the provision of healthcare and authorize for your insurance to be billed (if applicable) for the services provided during this visit. Depending on your insurance coverage, you may receive a charge related to this service.  I need to obtain your verbal consent now. Are you willing to proceed with your visit today? [facility] has provided verbal consent on 08/21/2022 for a virtual visit (video or telephone). Freddy Finner, NP  Date: 08/21/2022 2:58 PM  Virtual Visit via Video Note   I, Freddy Finner, connected with  Mycala Warshawsky Bains  ([id number], 1975-10-25) on 08/21/22 at  3:00 PM EDT by a video-enabled telemedicine application and verified that I am speaking with the correct person using two identifiers.  Location: Patient: Virtual Visit Location  Patient: Home Provider: Virtual Visit Location Provider: Home Office   I discussed the limitations of evaluation and management by telemedicine and the availability of in person appointments. The patient expressed understanding and agreed to proceed.    History of Present Illness: Wendy Mueller is a 47 y.o. who identifies as a female who was assigned female at birth, and is being seen today for cold symptoms.  On vacation (cruise[place], [address] [place], [place]) last week, noted around Thursday was starting to feel the best. Developed a cough- with some drainage- but can't get it up. Fitbit- resting heart rate is more elevated than her normal- which is 60's- and it is now in the 80's. Reports congestion, and sore throat, did have headache but it went away.  Denies shortness of breath, chest pain, ear pain, fevers or chills.   Problems:  Patient Active Problem List   Diagnosis Date Noted   Edema 11/06/2012   Tenosynovitis of ankle 09/23/2012   Edema of foot 09/23/2012    Allergies:  Allergies  Allergen Reactions   Penicillins Hives   Medications:  Current Outpatient Medications:    Azelastine HCl (ASTEPRO) 0.15 % SOLN, Place into the nose as needed., Disp: , Rfl:    NUVARING 0.12-0.015 MG/24HR vaginal ring, , Disp: , Rfl:    omeprazole (PRILOSEC) 20 MG capsule, Take 1 capsule daily at least 30 minutes before first dose of Carafate., Disp: 30 capsule, Rfl: 0   psyllium (REGULOID) 0.52 G capsule, Take 0.52  g by mouth daily., Disp: , Rfl:    sucralfate (CARAFATE) 1 g tablet, Take 1 tablet (1 g total) by mouth 4 (four) times daily -  with meals and at bedtime., Disp: 40 tablet, Rfl: 0  Observations/Objective: Patient is well-developed, well-nourished in no acute distress.  Resting comfortably  at home.  Head is normocephalic, atraumatic.  No labored breathing.  Speech is clear and coherent with logical content.  Patient is alert and oriented at baseline.    Assessment and  Plan:  1. Viral URI with cough  - benzonatate (TESSALON) 100 MG capsule; Take 1 capsule (100 mg total) by mouth 3 (three) times daily as needed for cough.  Dispense: 30 capsule; Refill: 0 - promethazine-dextromethorphan (PROMETHAZINE-DM) 6.25-15 MG/5ML syrup; Take 5 mLs by mouth 4 (four) times daily as needed for cough.  Dispense: 118 mL; Refill: 0  URI recommendations: - Increased rest - Increasing Fluids - Acetaminophen / ibuprofen as needed for fever/pain.  - Salt water gargling, chloraseptic spray and throat lozenges - Mucinex if mucus is present and increasing.  - Saline nasal spray if congestion or if nasal passages feel dry. - Humidifying the air.    Reviewed side effects, risks and benefits of medication.    Patient acknowledged agreement and understanding of the plan.   Past Medical, Surgical, Social History, Allergies, and Medications have been Reviewed.     Follow Up Instructions: I discussed the assessment and treatment plan with the patient. The patient was provided an opportunity to ask questions and all were answered. The patient agreed with the plan and demonstrated an understanding of the instructions.  A copy of instructions were sent to the patient via MyChart unless otherwise noted below.    The patient was advised to call back or seek an in-person evaluation if the symptoms worsen or if the condition fails to improve as anticipated.  Time:  I spent 10 minutes with the patient via telehealth technology discussing the above problems/concerns.    Freddy Finner, NP

## 2022-10-30 ENCOUNTER — Ambulatory Visit
Admission: RE | Admit: 2022-10-30 | Discharge: 2022-10-30 | Disposition: A | Discharge status: Home / Self Care | Payer: BC Managed Care – PPO | Source: Ambulatory Visit | Attending: Internal Medicine | Admitting: Internal Medicine

## 2022-10-30 VITALS — BP 147/90 | HR 79 | Temp 98.6°F | Resp 16

## 2022-10-30 DIAGNOSIS — J329 Chronic sinusitis, unspecified: Secondary | ICD-10-CM | POA: Diagnosis not present

## 2022-10-30 DIAGNOSIS — B9689 Other specified bacterial agents as the cause of diseases classified elsewhere: Secondary | ICD-10-CM | POA: Diagnosis not present

## 2022-10-30 MED ORDER — AZITHROMYCIN 250 MG PO TABS
250.0000 mg | ORAL_TABLET | Freq: Every day | ORAL | 0 refills | Status: AC
Start: 2022-10-30 — End: ?

## 2022-10-30 NOTE — ED Provider Notes (Signed)
[facility]    CSN: [id number] Arrival date & time: 10/30/22  1339      History   Chief Complaint Chief Complaint  Patient presents with   Nasal Congestion    HPI Wendy Mueller is a 47 y.o. female presents for sinus pain.  Patient reports 2 weeks of sinus pressure/pain with congestion, postnasal drip causing cough.  Denies any fevers, sore throat, ear pain, body aches, shortness of breath.  Does have a history of sinus infections.  She has been using Flonase and nasal rinses as well as over-the-counter sinus medications without much improvement.  No asthma or smoking history.  No other concerns at this time. HPI  History reviewed. No pertinent past medical history.  Patient Active Problem List   Diagnosis Date Noted   Edema 11/06/2012   Tenosynovitis of ankle 09/23/2012   Edema of foot 09/23/2012    History reviewed. No pertinent surgical history.  OB History   No obstetric history on file.      Home Medications    Prior to Admission medications   Medication Sig Start Date End Date Taking? Authorizing Provider  azithromycin (ZITHROMAX) 250 MG tablet Take 1 tablet (250 mg total) by mouth daily. Take first 2 tablets together, then 1 every day until finished. 10/30/22  Yes Radford Pax, NP  losartan (COZAAR) 50 MG tablet Take 1 tablet by mouth daily. 08/28/22 08/28/23 Yes [provider]  metFORMIN (GLUCOPHAGE-XR) 500 MG 24 hr tablet Take 1 tablet by mouth daily with breakfast. 02/27/22  Yes [provider]  metoprolol succinate (TOPROL-XL) 100 MG 24 hr tablet Take 1 tablet by mouth daily. 02/27/22  Yes [provider]  Azelastine HCl (ASTEPRO) 0.15 % SOLN Place into the nose as needed.    [provider]  benzonatate (TESSALON) 100 MG capsule Take 1 capsule (100 mg total) by mouth 3 (three) times daily as needed for cough. 08/21/22   Freddy Finner, NP  NUVARING 0.12-0.015 MG/24HR vaginal ring  09/06/12   [provider]  omeprazole (PRILOSEC) 20 MG capsule Take 1 capsule daily at least 30 minutes before first dose of Carafate. 05/11/17   Molpus, John, MD  promethazine-dextromethorphan (PROMETHAZINE-DM) 6.25-15 MG/5ML syrup Take 5 mLs by mouth 4 (four) times daily as needed for cough. 08/21/22   Freddy Finner, NP  psyllium (REGULOID) 0.52 G capsule Take 0.52 g by mouth daily.    [provider]  sucralfate (CARAFATE) 1 g tablet Take 1 tablet (1 g total) by mouth 4 (four) times daily -  with meals and at bedtime. 05/11/17   Molpus, John, MD    Family History Family History  Problem Relation Age of Onset   Hypertension Mother     Social History Social History   Tobacco Use   Smoking status: Never   Smokeless tobacco: Never  Substance Use Topics   Alcohol use: Yes    Comment: socially   Drug use: No     Allergies   Penicillins   Review of Systems Review of Systems  HENT:  Positive for postnasal drip, sinus pressure and sinus pain.   Respiratory:  Positive for cough.      Physical Exam Triage Vital Signs ED Triage Vitals  Encounter Vitals Group     BP 10/30/22 1349 (!) 147/90     Systolic BP Percentile --      Diastolic BP Percentile --      Pulse Rate 10/30/22 1349 79  Resp 10/30/22 1349 16     Temp 10/30/22 1349 98.6 F (37 C)     Temp Source 10/30/22 1349 Oral     SpO2 10/30/22 1349 95 %     Weight --      Height --      Head Circumference --      Peak Flow --      Pain Score 10/30/22 1352 0     Pain Loc --      Pain Education --      Exclude from Growth Chart --    No data found.  Updated Vital Signs BP (!) 147/90 (BP Location: Right Arm)   Pulse 79   Temp 98.6 F (37 C) (Oral)   Resp 16   SpO2 95%   Visual Acuity Right Eye Distance:   Left Eye Distance:   Bilateral Distance:    Right Eye Near:   Left Eye Near:    Bilateral Near:     Physical Exam Vitals and nursing note reviewed.  Constitutional:      General: She is not in acute  distress.    Appearance: She is well-developed. She is not ill-appearing.  HENT:     Head: Normocephalic and atraumatic.     Right Ear: Tympanic membrane and ear canal normal.     Left Ear: Tympanic membrane and ear canal normal.     Nose: Congestion present.     Right Turbinates: Swollen and pale.     Left Turbinates: Swollen and pale.     Comments: Tender to palpation to ethmoid sinuses    Mouth/Throat:     Mouth: Mucous membranes are moist.     Pharynx: Oropharynx is clear. Uvula midline. No posterior oropharyngeal erythema.     Tonsils: No tonsillar exudate or tonsillar abscesses.  Eyes:     Conjunctiva/sclera: Conjunctivae normal.     Pupils: Pupils are equal, round, and reactive to light.  Cardiovascular:     Rate and Rhythm: Normal rate and regular rhythm.     Heart sounds: Normal heart sounds.  Pulmonary:     Effort: Pulmonary effort is normal.     Breath sounds: Normal breath sounds.  Musculoskeletal:     Cervical back: Normal range of motion and neck supple.  Lymphadenopathy:     Cervical: No cervical adenopathy.  Skin:    General: Skin is warm and dry.  Neurological:     General: No focal deficit present.     Mental Status: She is alert and oriented to person, place, and time.  Psychiatric:        Mood and Affect: Mood normal.        Behavior: Behavior normal.      UC Treatments / Results  Labs (all labs ordered are listed, but only abnormal results are displayed) Labs Reviewed - No data to display  EKG   Radiology No results found.  Procedures Procedures (including critical care time)  Medications Ordered in UC Medications - No data to display  Initial Impression / Assessment and Plan / UC Course  I have reviewed the triage vital signs and the nursing notes.  Pertinent labs & imaging results that were available during my care of the patient were reviewed by me and considered in my medical decision making (see chart for details).     Reviewed  exam and symptoms with patient.  No red flags.  Will start Zithromax given length of symptoms.  Patient has allergy to penicillin and  requests a Z-Pak.  Continue Flonase and nasal rinses as needed.  PCP follow-up if symptoms do not improve.  ER precautions reviewed and patient verbalized understanding. Final Clinical Impressions(s) / UC Diagnoses   Final diagnoses:  Bacterial sinusitis     Discharge Instructions      Continue Flonase nasal rinses as needed.  Zithromax as prescribed.  Lots of rest and fluids.  Please follow-up with your PCP if symptoms do not improve.  Please go to the ER for any worsening symptoms.  Hope you feel better soon!    ED Prescriptions     Medication Sig Dispense Auth. Provider   azithromycin (ZITHROMAX) 250 MG tablet Take 1 tablet (250 mg total) by mouth daily. Take first 2 tablets together, then 1 every day until finished. 6 tablet Radford Pax, NP      PDMP not reviewed this encounter.   Radford Pax, NP 10/30/22 [phone]
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
## 2022-10-30 NOTE — Discharge Instructions (Signed)
Continue Flonase nasal rinses as needed.  Zithromax as prescribed.  Lots of rest and fluids.  Please follow-up with your PCP if symptoms do not improve.  Please go to the ER for any worsening symptoms.  Hope you feel better soon!

## 2024-01-01 ENCOUNTER — Other Ambulatory Visit: Payer: Self-pay

## 2024-01-01 ENCOUNTER — Emergency Department (HOSPITAL_BASED_OUTPATIENT_CLINIC_OR_DEPARTMENT_OTHER)
Admission: EM | Admit: 2024-01-01 | Discharge: 2024-01-01 | Disposition: A | Discharge status: Home / Self Care | Attending: Emergency Medicine | Admitting: Emergency Medicine

## 2024-01-01 ENCOUNTER — Encounter (HOSPITAL_BASED_OUTPATIENT_CLINIC_OR_DEPARTMENT_OTHER): Payer: Self-pay

## 2024-01-01 ENCOUNTER — Emergency Department (HOSPITAL_BASED_OUTPATIENT_CLINIC_OR_DEPARTMENT_OTHER)

## 2024-01-01 DIAGNOSIS — Y92410 Unspecified street and highway as the place of occurrence of the external cause: Secondary | ICD-10-CM | POA: Diagnosis not present

## 2024-01-01 DIAGNOSIS — S298XXA Other specified injuries of thorax, initial encounter: Secondary | ICD-10-CM | POA: Diagnosis present

## 2024-01-01 DIAGNOSIS — Z79899 Other long term (current) drug therapy: Secondary | ICD-10-CM | POA: Insufficient documentation

## 2024-01-01 HISTORY — DX: Type 2 diabetes mellitus without complications: E11.9

## 2024-01-01 HISTORY — DX: Essential (primary) hypertension: I10

## 2024-01-01 LAB — TROPONIN T, HIGH SENSITIVITY: Troponin T High Sensitivity: <15 ng/L (ref 0–19)

## 2024-01-01 MED ORDER — METHOCARBAMOL 500 MG PO TABS: 500.0000 mg | ORAL_TABLET | Freq: Three times a day (TID) | ORAL | 0 refills | Status: AC | PRN
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
## 2024-01-01 NOTE — Discharge Instructions (Addendum)
 From a trauma standpoint the workup was reassuring.  However the x-ray did show potentially a nodule on the right side.  Will need follow-up CT to further evaluate.  Your primary care doctor can do this.
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
